# Patient Record
Sex: Female | Born: 1985 | Race: White | Hispanic: No | Marital: Married
Health system: Southern US, Community
[De-identification: ages and names within clinical notes are randomized; demographics above are authoritative.]

---

## 2011-08-05 ENCOUNTER — Ambulatory Visit: Payer: Self-pay | Admitting: Internal Medicine

## 2012-05-08 IMAGING — US US EXTREM LOW VENOUS*L*
1 series · 17 of 24 positions shown · non-contrast
Comparison: none

REASON FOR EXAM: pain swelling eval DVT  CR  [phone]
COMMENTS:

[Series 1: us extrem low venous*left* · 17 of 33 slices shown]
[im 1/33]
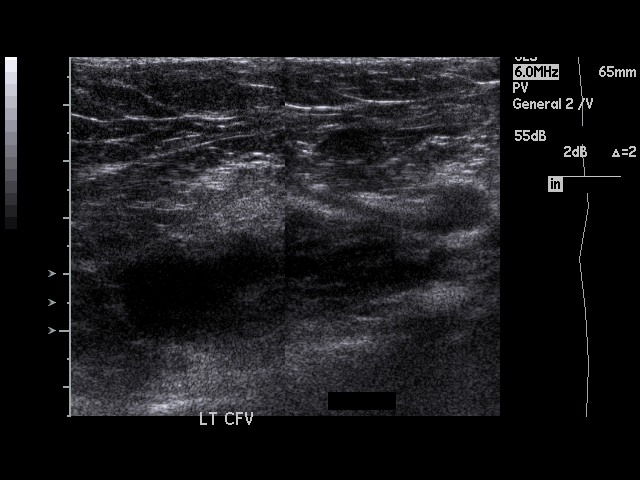
[im 3/33]
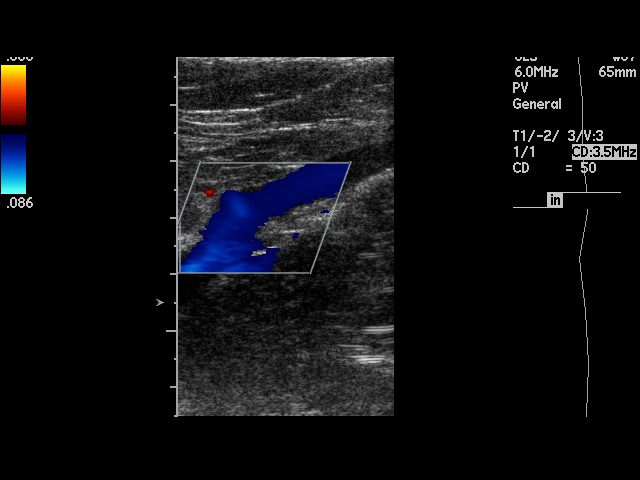
[im 5/33]
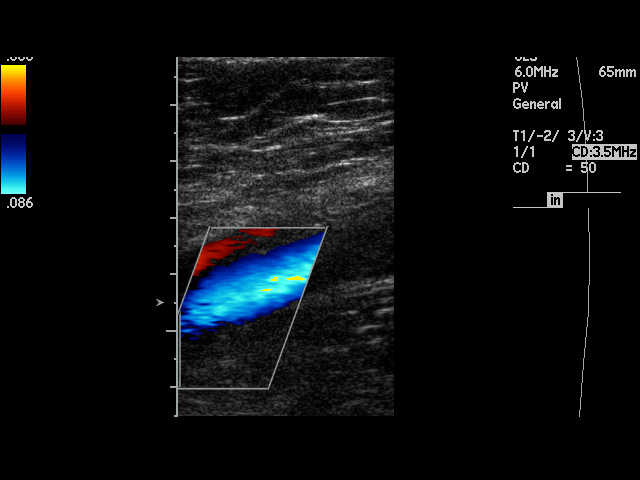
[im 6/33]
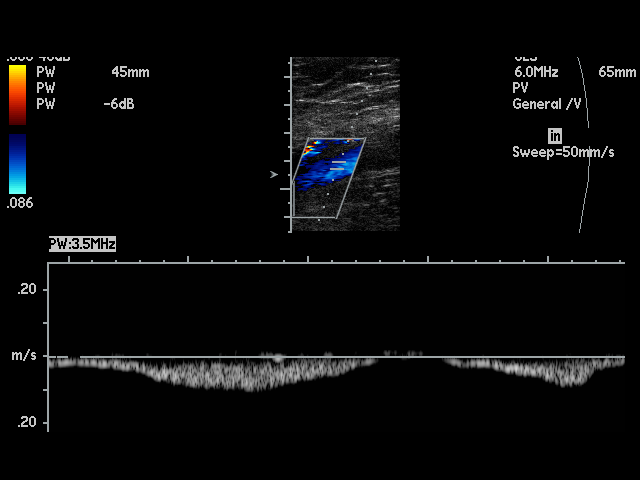
[im 9/33]
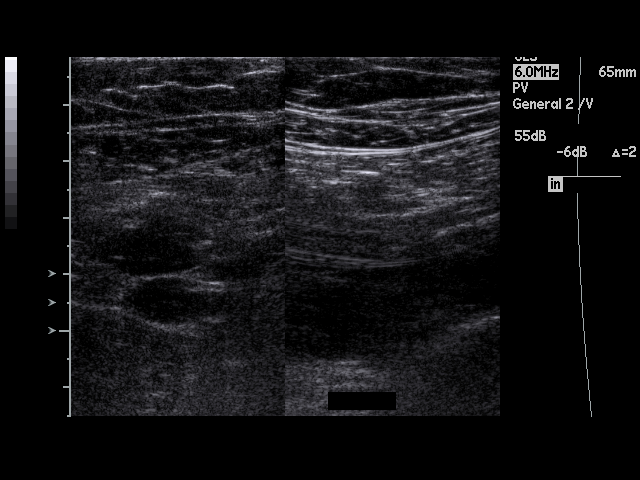
[im 10/33]
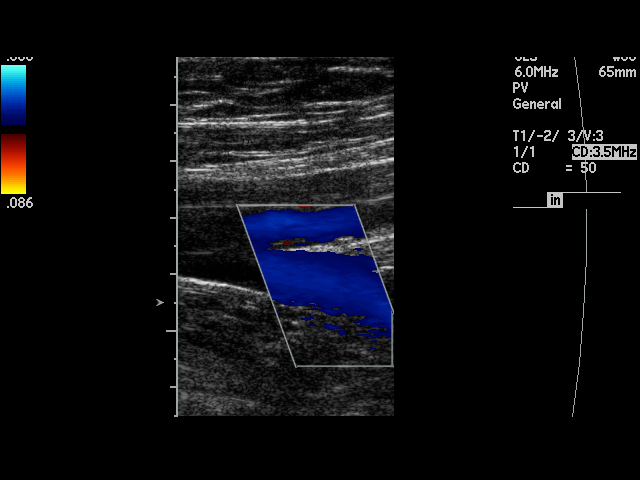
[im 13/33]
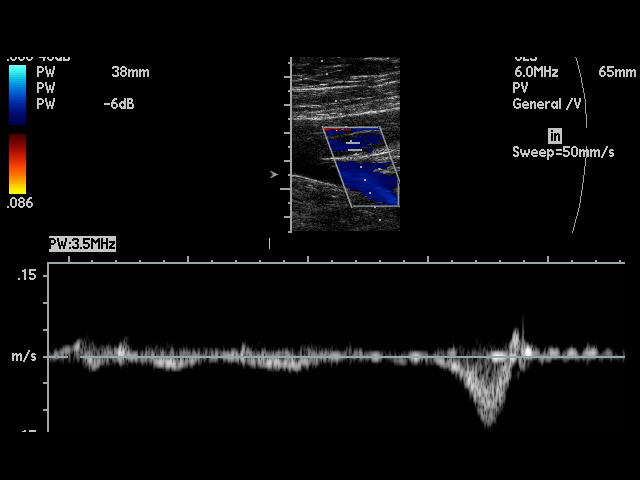
[im 14/33]
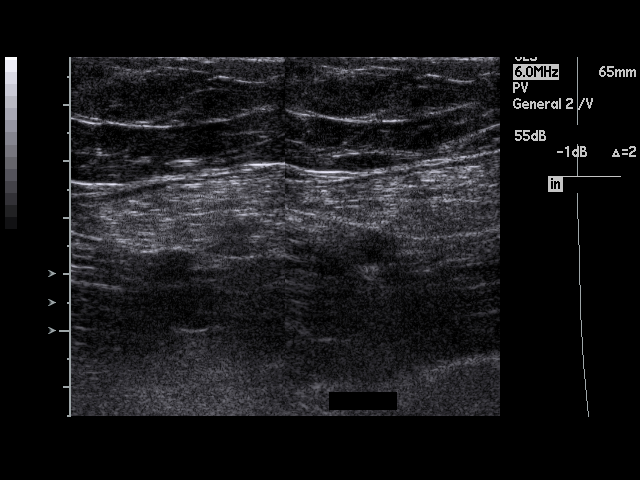
[im 17/33]
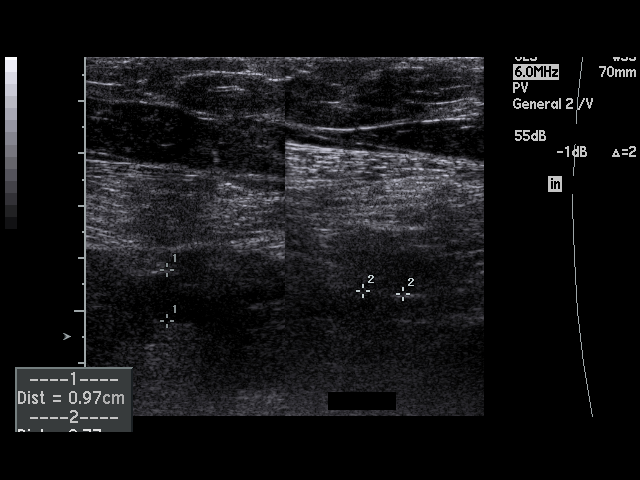
[im 19/33]
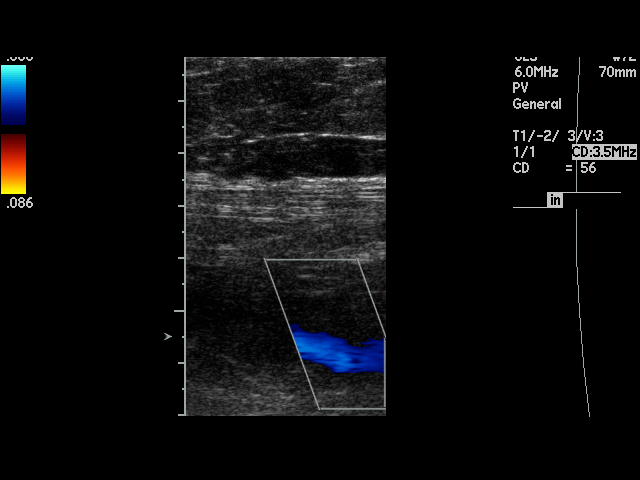
[im 20/33]
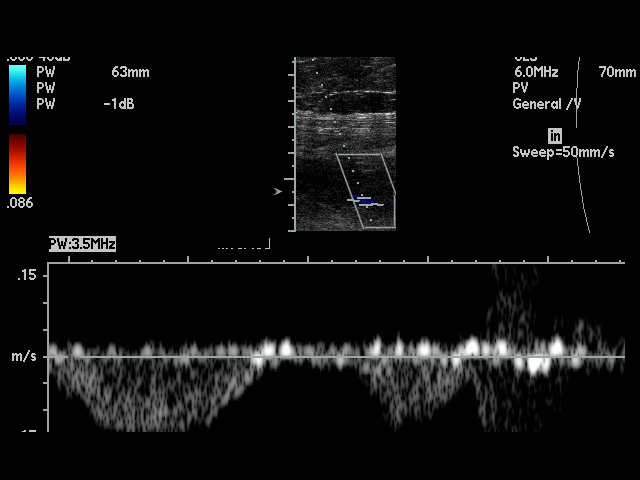
[im 23/33]
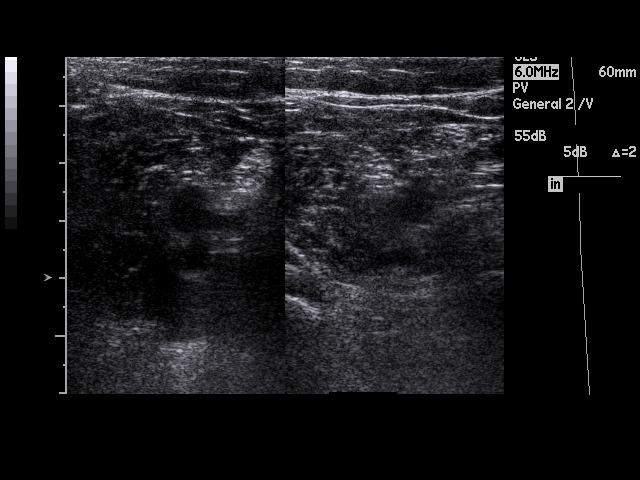
[im 24/33]
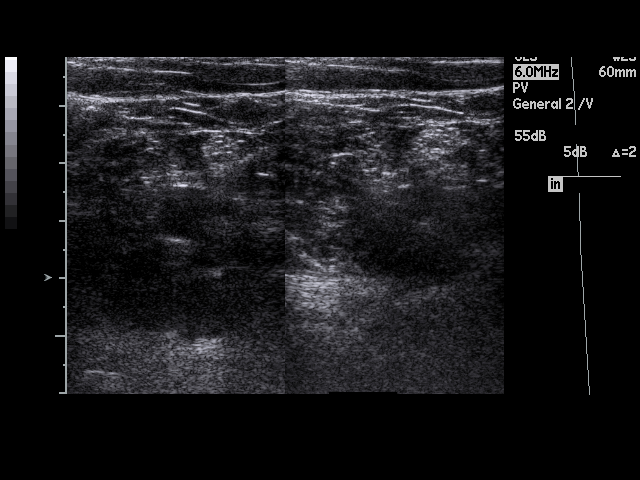
[im 27/33]
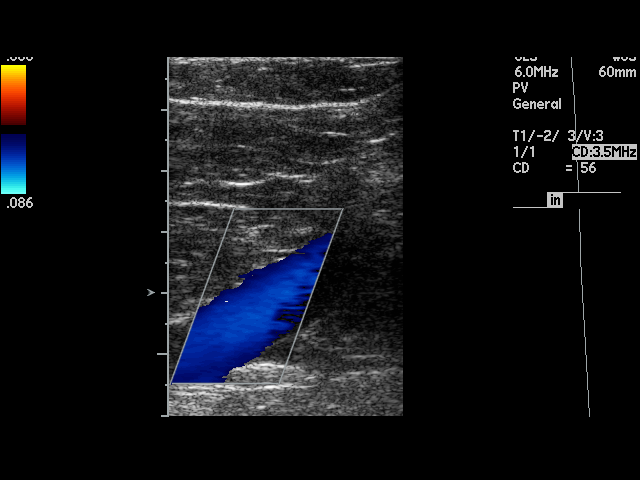
[im 28/33]
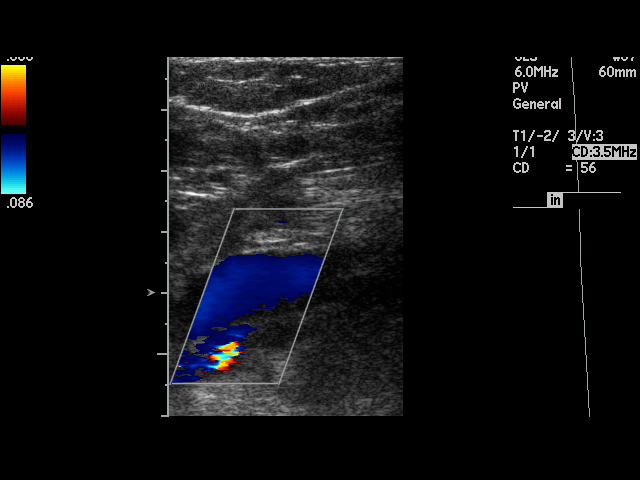
[im 30/33]
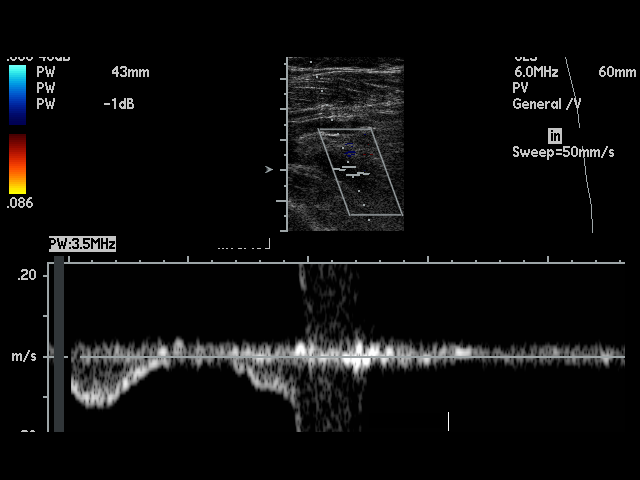
[im 33/33]
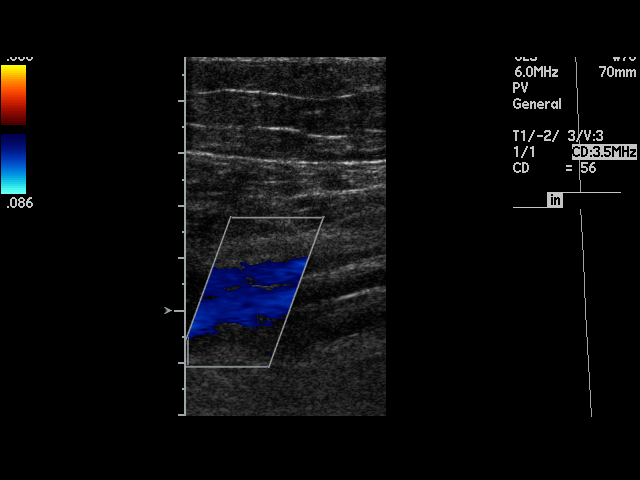

[17 of 24 positions shown; findings below may reference images not displayed]

PROCEDURE:     US  - US DOPPLER LOW EXTR LEFT  - August 05, 2011  [DATE]

RESULT:     The phasic, augmentation and Valsalva flow waveforms are normal
in appearance. The left femoral and popliteal vein shows complete
compressibility throughout its course. Doppler examination shows no
occlusion or evidence for deep vein thrombosis.
IMPRESSION: No deep vein thrombosis is identified in the left leg.

## 2013-06-20 ENCOUNTER — Emergency Department: Payer: Self-pay | Admitting: Emergency Medicine

## 2013-06-20 LAB — URINALYSIS, COMPLETE
Bilirubin,UR: NEGATIVE
Blood: NEGATIVE
Glucose,UR: NEGATIVE mg/dL (ref 0–75)
Ketone: NEGATIVE
Leukocyte Esterase: NEGATIVE
Nitrite: NEGATIVE
Ph: 6 (ref 4.5–8.0)
Protein: NEGATIVE
RBC,UR: NONE SEEN /HPF (ref 0–5)
Specific Gravity: 1.019 (ref 1.003–1.030)
WBC UR: 1 /HPF (ref 0–5)

## 2013-09-11 ENCOUNTER — Encounter: Payer: Self-pay | Admitting: Obstetrics and Gynecology

## 2013-09-11 LAB — PROTEIN / CREATININE RATIO, URINE
Creatinine, Urine: 195.8 mg/dL — ABNORMAL HIGH (ref 30.0–125.0)
Protein, Random Urine: 7 mg/dL (ref 0–12)
Protein/Creat. Ratio: 36 mg/gCREAT (ref 0–200)

## 2013-09-11 LAB — BASIC METABOLIC PANEL
Calcium, Total: 9.7 mg/dL (ref 8.5–10.1)
Chloride: 107 mmol/L (ref 98–107)
Co2: 25 mmol/L (ref 21–32)
Creatinine: 0.7 mg/dL (ref 0.60–1.30)
EGFR (Non-African Amer.): >60
Glucose: 79 mg/dL (ref 65–99)
Osmolality: 269 (ref 275–301)

## 2013-09-11 LAB — CBC WITH DIFFERENTIAL/PLATELET
Basophil #: 0.1 10*3/uL (ref 0.0–0.1)
Eosinophil #: 0.1 10*3/uL (ref 0.0–0.7)
Eosinophil %: 1.3 %
HCT: 37.2 % (ref 35.0–47.0)
HGB: 12.6 g/dL (ref 12.0–16.0)
Lymphocyte %: 38.1 %
MCH: 25.9 pg — ABNORMAL LOW (ref 26.0–34.0)
MCHC: 33.8 g/dL (ref 32.0–36.0)
MCV: 77 fL — ABNORMAL LOW (ref 80–100)
Neutrophil #: 4.6 10*3/uL (ref 1.4–6.5)
Platelet: 197 10*3/uL (ref 150–440)
RBC: 4.85 10*6/uL (ref 3.80–5.20)
RDW: 15.4 % — ABNORMAL HIGH (ref 11.5–14.5)
WBC: 8.8 10*3/uL (ref 3.6–11.0)

## 2013-09-11 LAB — HEMOGLOBIN A1C: Hemoglobin A1C: 5.4 % (ref 4.2–6.3)

## 2013-09-28 ENCOUNTER — Encounter: Payer: Self-pay | Admitting: Maternal & Fetal Medicine

## 2013-09-28 LAB — FERRITIN: Ferritin (ARMC): 28 ng/mL (ref 8–388)

## 2013-09-28 LAB — T4, FREE: Free Thyroxine: 1.12 ng/dL (ref 0.76–1.46)

## 2014-03-24 IMAGING — CR DG LUMBAR SPINE 2-3V
1 series · 3 of 3 positions shown · non-contrast
Comparison: none

REASON FOR EXAM: sudden LBP, no h/o same
COMMENTS:

PROCEDURE:     DXR - DXR LUMBAR SPINE AP AND LATERAL  - June 20, 2013  [DATE]
RESULT:     Comparison: None

[Series 1: t lumbar spine ap · 0.14mm/px · 3 of 3 slices shown]
[im 1/3]
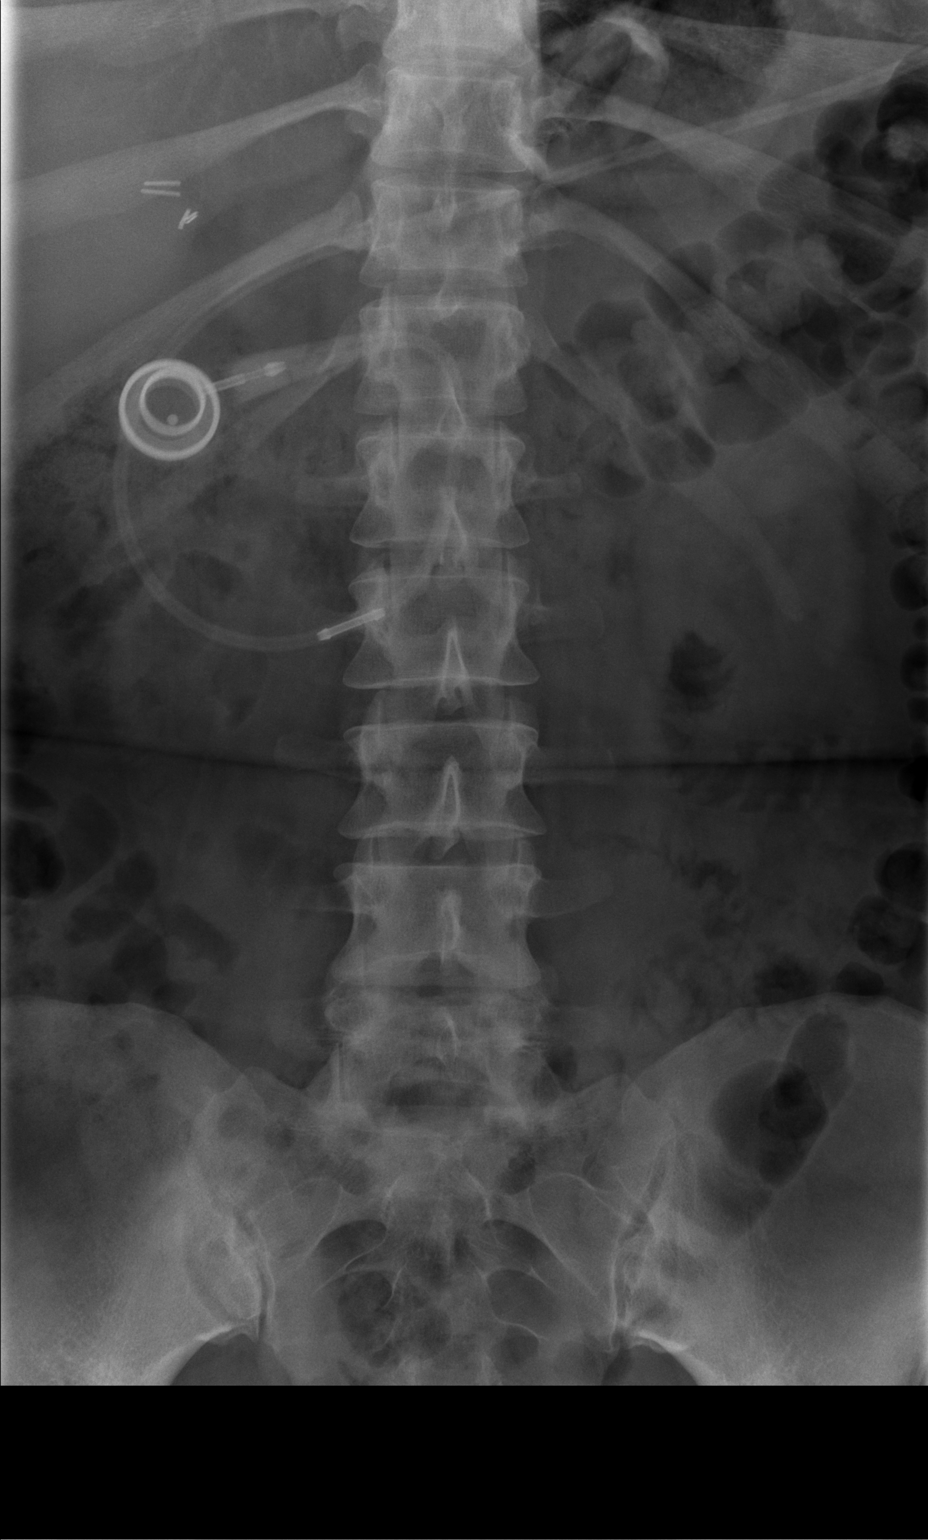
[im 2/3]
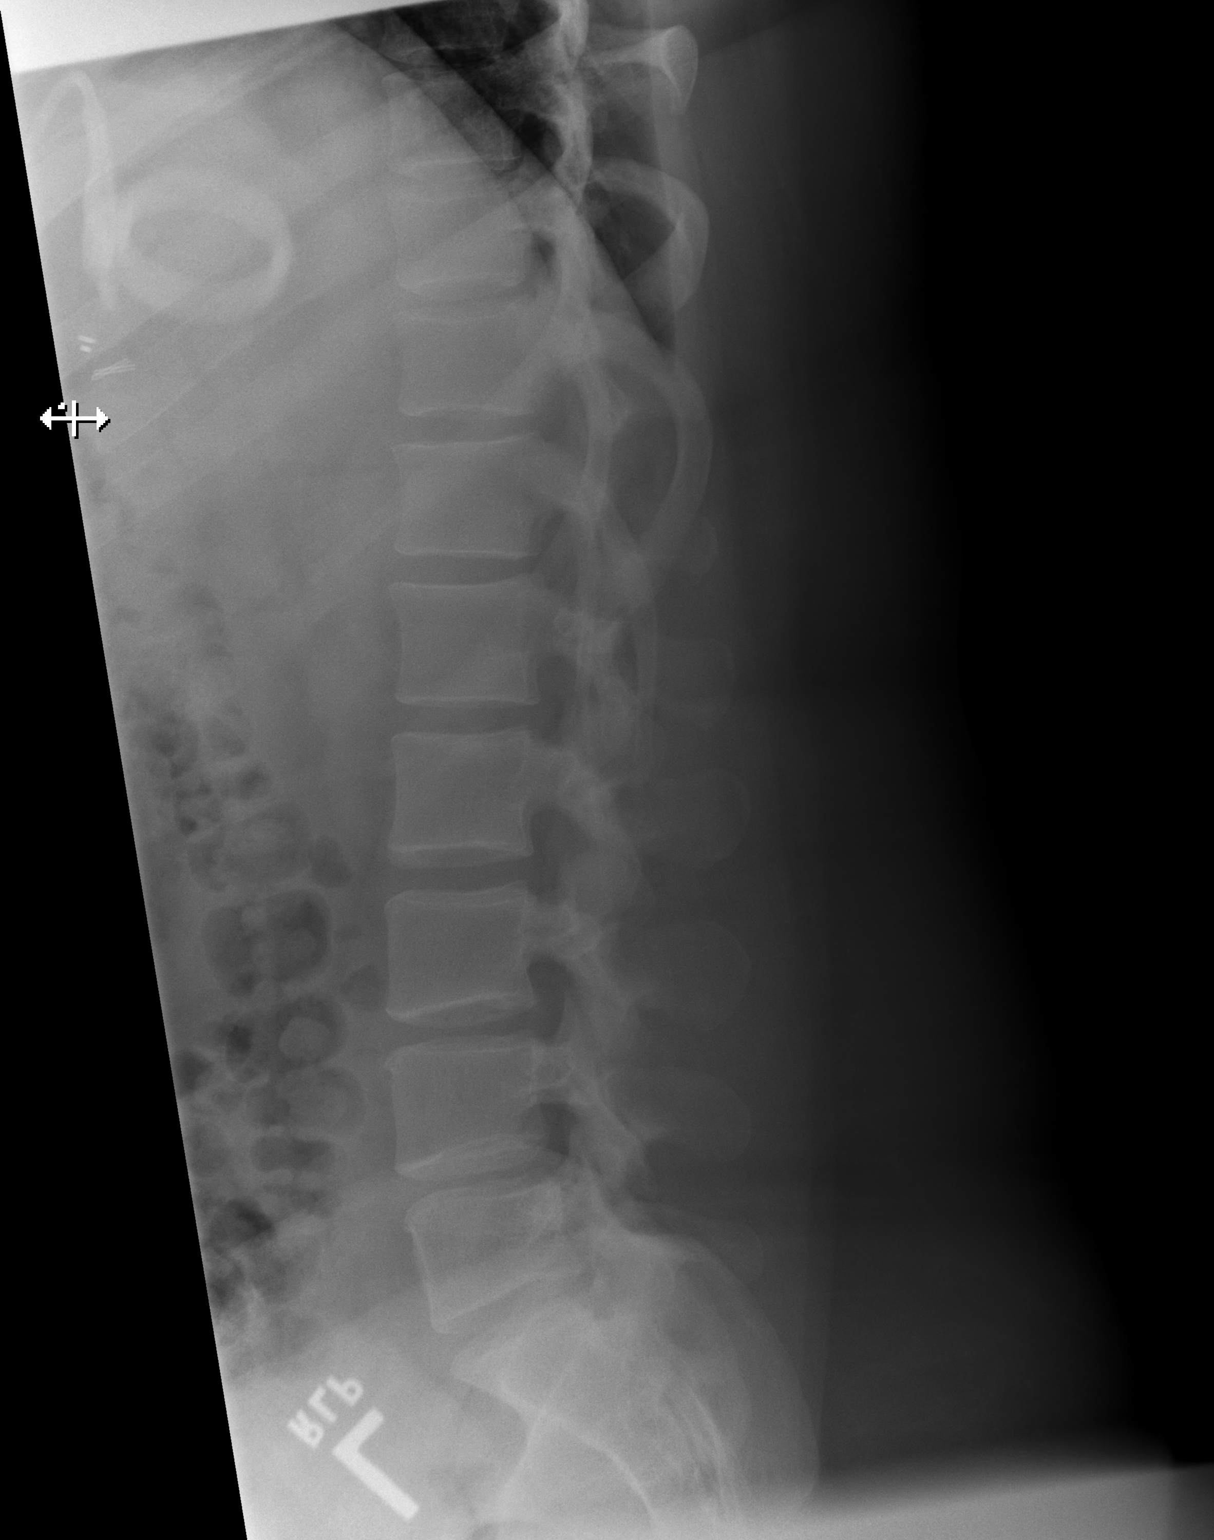
[im 3/3]
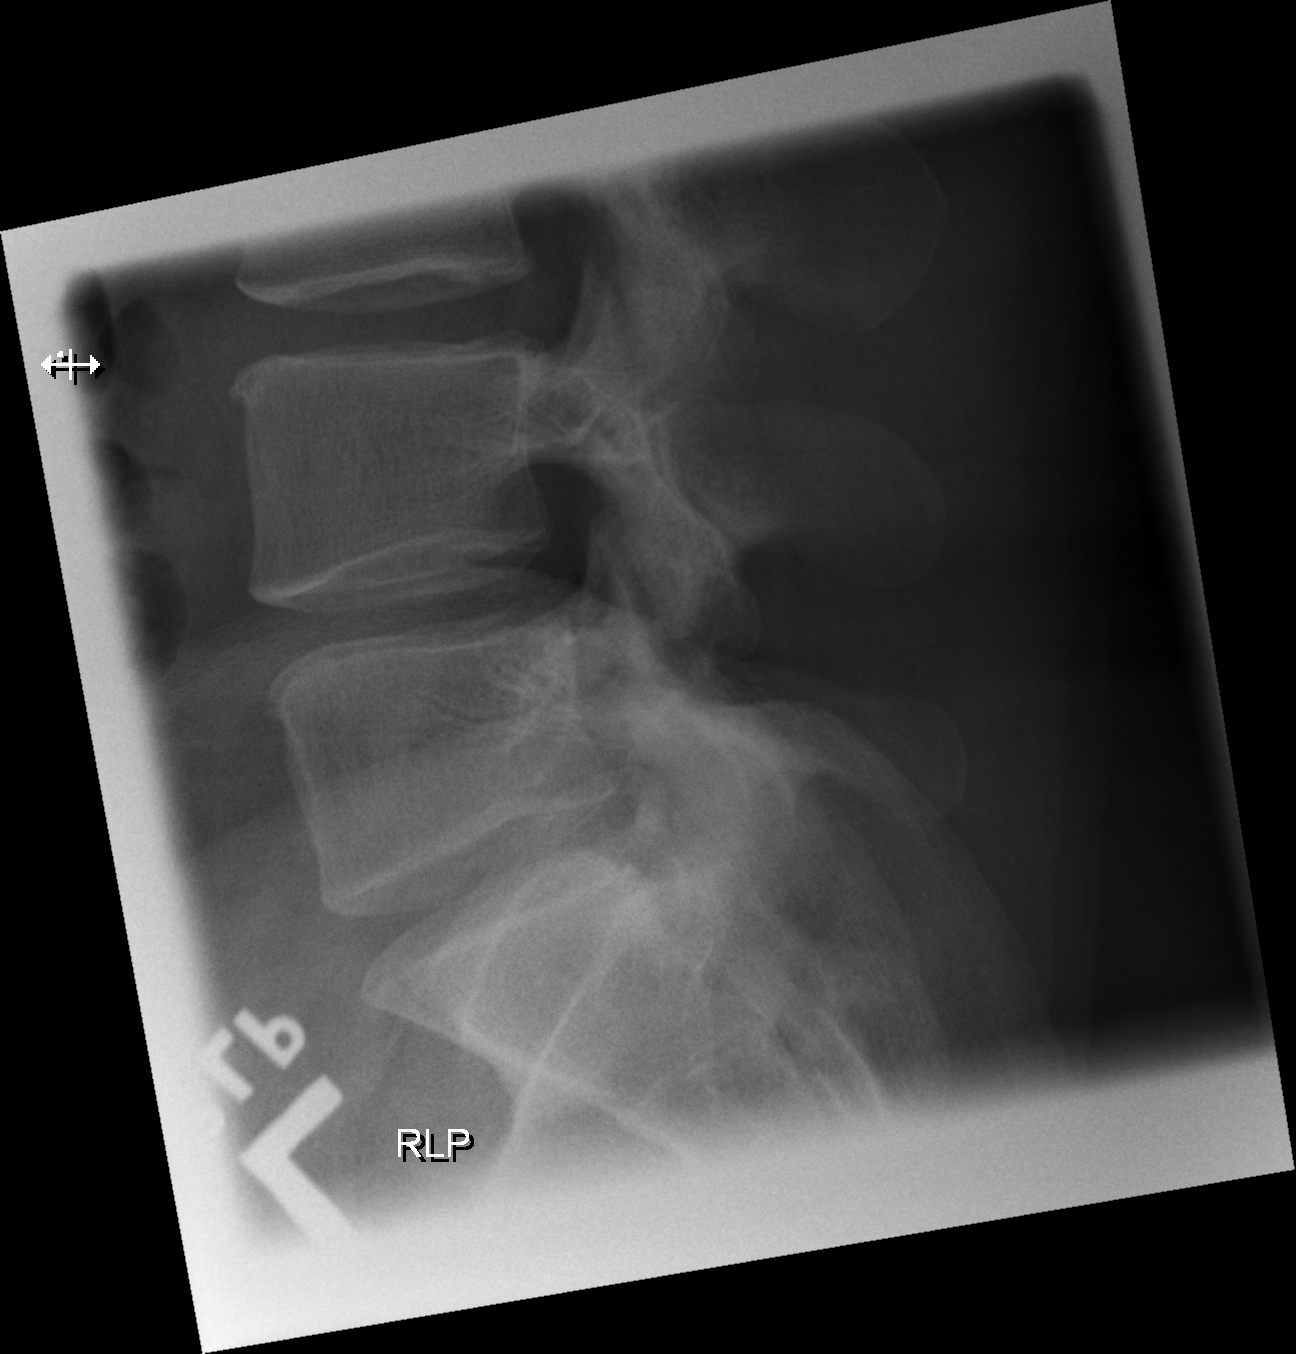

[3 of 3 positions shown; findings below may reference images not displayed]

FINDINGS: AP and lateral views of the lumbar spine and a coned down view of the
lumbosacral junction are provided.

There are 5 nonrib bearing lumbar-type vertebral bodies. The vertebral body
heights are maintained. The alignment is anatomic. There is no
spondylolysis. There is no acute fracture or static listhesis. The disc
spaces are maintained.

The SI joints are unremarkable.
IMPRESSION: 1. No acute osseous abnormality of the lumbar spine.

[REDACTED]

## 2015-02-08 NOTE — Consult Note (Signed)
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 She is overweight - BMI 50 (65 inches tall - 307 lbs) and has been her whole life - her parents are large also. She underwent lapband surgery in 2011  and lost no weight. She has not had complictions from the procedure except when eating very dry foods like chicken. She has a one year  h/o HTN and has been rxd labetalol in the past she recently restarted 100 bid which was increased to 200 mg bid on her last visit here.  [facility] with a gestation of 636w0destation.  Her BP then was also in the 130's/80's.  She is now complaining of nightmares that interrupt her sleep.  Although she does not think she snores, her husband is unsure.  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, can't exclude anterior infarct."   Past Obstetrical Hx primigravida   Home Medications: Medication Instructions Status  Aspir 81 81 mg oral tablet 1 tab(s) orally once a day Active  multivitamin, prenatal 1   once a day Active  folic acid 1 mg oral tablet 1 tab(s) orally once a day Active  labetalol 100 mg oral tablet 2  orally 2 times a day Active   Allergies:   Imitrex: Tachycardia, Anxiety  Vital Signs/Notes:  Nursing Vital Signs:  **Vital Signs.:   11-Dec-14 15:00  Vital Signs Type Admission  Temperature Temperature (F) 97.9  Pulse Pulse 78  Respirations Respirations 18  Systolic BP Systolic BP 14557Diastolic BP (mmHg) Diastolic BP (mmHg) 86  Pulse Ox % Pulse Ox % 99   Perinatal Consult:  LMP 01-Aug-2013   PGyn Hx any evaluation of uterine cavity  anovulation, took clomid , SIS negative   Past Medical History cont'd  obesity lap band 2011 no weight loss- had prepregnancy GCT in w/u of infertility that was normal , no apnea or snoring  recurrent sinusitis  HTN 2013 started on meds - pt was surprise dhow it showed up all of a sudden BP usually in 140/90 range  anxiety and depression was on lexapro- now off for 6 months - feels like her problems were related to issues at work .  Patient thinks she is O negative.   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  HTN - Doing well  on current dose of labetalol, already started aspirin  2.  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 3.  Morbid obesity with normal HbA1C   4. Nightmares with sleep interruption, may snore 6. TSH that is normal (2.4) but a bit high for early pregnancy (we would expect < 2) 7. Her MCV was 77 from her CBC on 11/24 (Hct was 37 and her Hb was 12.6). 8. O negative per patient   Recommendations 1.  HTN - Continue current dose of labetalol and aspirin  2.  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 Jeani Hawking Ward cardiologist at [facility] 3.  Morbid obesity with normal HbA1C - see recommendations for fetal  surveillance.  [facility] concurs with referral to [facility] for further prenatal care - needs nurse visit and NOB appointment.  RTC [facility] in 5 weeks. 4. Nightmares with sleep interruption, may snore - recommend sleep study 5. After genetic counseling, elects first trimester screening.  RTC [facility] in 5 weeks. 6. TSH that is normal (2.4) but a bit high for early pregnancy (we would expect < 2) so we are getting a free T4 today. 7. Hb electrophoresis and ferritin today to assess low MCV. 8. Report any bleeding   Plan:  Genetic Counseling yes, Met with D. Wells today   Prenatal Diagnosis Options First trimester, At [facility]   Ultrasound at what gestational ages Monthly > 28 weeks, every 4 weeks   Antepartum Testing Weekly, Starting at 51 to 36 weeks   Delivery Mode Vaginal   Additional Testing Ferritin, Hb electrophoresis, free T4   Delivery at what gestational age [redacted] weeks    Comments I spent 45 minutes in consultation, more than half of which was spent counseling the  patient and coordinating her care.    Total Time Spent with Patient 45 minutes   >50% of visit spent in couseling/coordination of care yes   Office Use Only [place] Visit Level 5 (63mn) EST comprehensive office/outpt   Coding Description: MATERNAL CONDITIONS/HISTORY INDICATION(S).   HTN - Chronic.   Obesity - BMI greater than equal to 30.  Electronic Signatures: JDellia Nims(MD)  (Signed 11-Dec-14 16:42)  Authored: Referral, Home Medications, Allergies, Vital Signs/Notes, Consult, Exam, Impression, Plan, Other Comments, Billing, Coding Description   Last Updated: 11-Dec-14 16:42 by JDellia Nims(MD)

## 2015-02-08 NOTE — Consult Note (Signed)
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 She underwent lapband surgery in 2011  and lost no weight. She has not had complictions from the procedure except when eating very dry foods like chicken. She has a one year  h/o HTN and has been rxd labetalol in the past she recently restarted 100 bid .   Past Obstetrical Hx primigravida   Home Medications:  Medication Instructions Status  Aspir 81 81 mg oral tablet 1 tab(s) orally once a day Active  multivitamin, prenatal 1   once a day Active  folic acid 1 mg oral tablet 1 tab(s) orally once a day Active  labetalol 100 mg oral tablet 0.5  orally 2 times a day Active   Allergies:   Imitrex: Rash  Vital Signs/Notes:  Nursing Vital Signs:  **Vital Signs.:   24-Nov-14 14:45  Vital Signs Type Routine; Perinatal Clinic  Temperature Temperature (F) 98.2  Celsius 36.7  Pulse Pulse 88  Respirations Respirations 20  Systolic BP Systolic BP 163  Diastolic BP (mmHg) Diastolic BP (mmHg) 80  Mean BP 107  Pulse Ox % Pulse Ox % 99   Perinatal Consult:  LMP 01-Aug-2013   PGyn Hx any evaluation of uterine cavity  anovulation, took clomid , SIS negative   Past Medical History cont'd obesity lap band 2011 no weight loss- had prepregnancy GCT in w/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, siteschool teacher   Soc Hx married, non smoker   Review Of Systems:  Subjective mild nausea   Constipation Yes    Tolerating Diet Yes    Exam:  Neck long neck , good jaw   Impression/Recommendations:  Impression pregnancy by ovulation dates - 5 w2d clomid pregnancy 1h/o htn on labetalol already started aspirin elevated today to 170/100 no sxs  -reviewed increased risk of preeclampsia about 20%  2elevated BMI 50-s/p lap band with no weight loss, neg GCT prepregnancy, no h/o apnea  3 recurrent sinusitis ok now  4 h/o anxiety off meds 5 family h/o cystic fibrosis aunt 6 teacher - advised to wash hands regularly   Recommendations 1 u/s to confirm IUP and fetal number 12/1 at [facility]  2 will increase to labetalol 200 bid and rx bp cuff, continue asa (given multiple risks preex and DVT risk reasonable to continue even though just 5 weeks ) 3 Will do labs from [facility] Obesity protocol which we reviewed together- CMP, TSH, hgbA1c , EKG ordered, needs nutrition visit discussed limiting weight gain, anesthesia consult in third trimester  4 CF screening done today - briefly  touched base with genetic counselor  5 Report depression if recurs  6 Discuss location of care with family and [facility] - We are happy to care for her in any capacity either here or in [place] depending on [facility] L&D and anesthesia capacity here.We can assume care now or later as desired by pt and referring practice.   Plan:  Genetic Counseling yes, if CF screen positive   Prenatal Diagnosis Options First trimester   Ultrasound at what gestational ages Monthly > 28 weeks   Antepartum Testing Weekly, Starting at 57 to 36 weeks   Delivery Mode Vaginal, 41 weeks   Additional Testing Thyroid panel, HbA1C    Total Time Spent with Patient 30 minutes   >50% of visit  spent in couseling/coordination of care yes   Office Use Only [address]  Level 2 ( ) NEW office consult exp prob focused   Coding Description: MATERNAL CONDITIONS/HISTORY INDICATION(S).   HTN - Chronic.   Obesity - BMI greater than equal to 30.  Electronic Signatures: Rondall Allegra (MD)  (Signed [phone] 16:40)  Authored: Referral, Home Medications, Allergies, Vital Signs/Notes, Consult, Exam, Impression, Plan, Billing, Coding Description   Last Updated: 24-Nov-14 16:40 by Rondall Allegra (MD)
